# Patient Record
Sex: Female | Born: 1955 | Race: White | Hispanic: No | Marital: Married | Smoking: Never smoker
Health system: Southern US, Community
[De-identification: ages and names within clinical notes are randomized; demographics above are authoritative.]

## PROBLEM LIST (undated history)

## (undated) DIAGNOSIS — Q249 Congenital malformation of heart, unspecified: Secondary | ICD-10-CM

## (undated) DIAGNOSIS — G43909 Migraine, unspecified, not intractable, without status migrainosus: Secondary | ICD-10-CM

## (undated) HISTORY — PX: CHOLECYSTECTOMY: SHX55

## (undated) HISTORY — PX: WISDOM TOOTH EXTRACTION: SHX21

---

## 2016-08-20 ENCOUNTER — Encounter (HOSPITAL_COMMUNITY): Payer: Self-pay | Admitting: Emergency Medicine

## 2016-08-20 ENCOUNTER — Emergency Department (HOSPITAL_COMMUNITY)
Admission: EM | Admit: 2016-08-20 | Discharge: 2016-08-20 | Disposition: A | Discharge status: Home / Self Care | Payer: Medicare Other | Attending: Emergency Medicine | Admitting: Emergency Medicine

## 2016-08-20 ENCOUNTER — Emergency Department (HOSPITAL_COMMUNITY): Payer: Medicare Other

## 2016-08-20 DIAGNOSIS — S82001A Unspecified fracture of right patella, initial encounter for closed fracture: Secondary | ICD-10-CM

## 2016-08-20 DIAGNOSIS — Y9301 Activity, walking, marching and hiking: Secondary | ICD-10-CM | POA: Insufficient documentation

## 2016-08-20 DIAGNOSIS — W010XXA Fall on same level from slipping, tripping and stumbling without subsequent striking against object, initial encounter: Secondary | ICD-10-CM | POA: Diagnosis not present

## 2016-08-20 DIAGNOSIS — Y92481 Parking lot as the place of occurrence of the external cause: Secondary | ICD-10-CM | POA: Diagnosis not present

## 2016-08-20 DIAGNOSIS — S8991XA Unspecified injury of right lower leg, initial encounter: Secondary | ICD-10-CM | POA: Diagnosis present

## 2016-08-20 DIAGNOSIS — Y999 Unspecified external cause status: Secondary | ICD-10-CM | POA: Insufficient documentation

## 2016-08-20 DIAGNOSIS — W19XXXA Unspecified fall, initial encounter: Secondary | ICD-10-CM

## 2016-08-20 HISTORY — DX: Migraine, unspecified, not intractable, without status migrainosus: G43.909

## 2016-08-20 HISTORY — DX: Congenital malformation of heart, unspecified: Q24.9

## 2016-08-20 MED ORDER — TRAMADOL HCL 50 MG PO TABS: 50.0000 mg | ORAL_TABLET | Freq: Four times a day (QID) | ORAL | 0 refills | Status: AC | PRN

## 2016-08-20 MED ORDER — TRAMADOL HCL 50 MG PO TABS: 50.0000 mg | ORAL_TABLET | Freq: Once | ORAL | Status: DC

## 2016-08-20 NOTE — ED Notes (Signed)
Patient given paper scrub pants.

## 2016-08-20 NOTE — Discharge Instructions (Signed)
You were seen in the ED today with a fracture of the patella, a small bone in the knee. We placed a knee immobilizer here in the ED. You will need to follow up with your orthopedist in the coming week upon return home.   Return to the nearest ED with any chest pain, difficulty breathing, nausea, or vomiting.

## 2016-08-20 NOTE — ED Triage Notes (Signed)
Patient reports she was walking and tripped and fell on right knee and left face. Abrasion noted to right palm and right knee. Swelling to right knee. Patient is alert and oriented x4. Denies blood thinner use. Patient is normally on 3L [place]. Patient states high 80% oxygen saturation is normal for her.

## 2016-08-20 NOTE — ED Notes (Signed)
Made radiology aware that patient would like copy of knee xray.

## 2016-08-20 NOTE — ED Provider Notes (Signed)
Emergency Department Provider Note   I have reviewed the triage vital signs and the nursing notes.   HISTORY  Chief Complaint Fall   HPI Jocelyn Byrd is a 60 y.o. female with PMH of congenital heart defect and migraine HA who presents to the emergency department for evaluation after fall. The patient has right knee pain and swelling along with left cheek pain. She was walking in a parking lot when she had a mechanical fall over the curb. She denies any loss of consciousness. No vomiting since the incident. She has mild bleeding and pain over the right knee. No pain in the hip or ankle. No preceding chest pain, dyspnea, or heart palpitations. No abdominal discomfort. The patient is on oxygen for congenital heart disease and reports "feeling great." She is running out of her oxygen with him next shipment being due on Friday. She reports that she may return out before then but is using oxygen intermittently and will call them again to see if they can deliver at sooner. [place] and is visiting friends here in [place]. She has an orthopedist there.   Past Medical History:  Diagnosis Date  . Congenital heart defect   . Migraine     There are no active problems to display for this patient.   Past Surgical History:  Procedure Laterality Date  . CHOLECYSTECTOMY    . WISDOM TOOTH EXTRACTION        Allergies Review of patient's allergies indicates not on file.  No family history on file.  Social History Social History  Substance Use Topics  . Smoking status: Never Smoker  . Smokeless tobacco: Never Used  . Alcohol use No    Review of Systems  Constitutional: No fever/chills Eyes: No visual changes. ENT: No sore throat. Left cheek pain.  Cardiovascular: Denies chest pain. Respiratory: Denies shortness of breath. Gastrointestinal: No abdominal pain.  No nausea, no vomiting.  No diarrhea.  No constipation. Genitourinary: Negative for  dysuria. Musculoskeletal: Negative for back pain. Right knee pain.  Skin: Negative for rash. Neurological: Negative for headaches, focal weakness or numbness.  10-point ROS otherwise negative.  ____________________________________________   PHYSICAL EXAM:  VITAL SIGNS: ED Triage Vitals  Enc Vitals Group     BP 08/20/16 1518 116/78     Pulse Rate 08/20/16 1518 78     Resp 08/20/16 1518 18     Temp 08/20/16 1518 98.3 F (36.8 C)     Temp Source 08/20/16 1518 Oral     SpO2 08/20/16 1518 (!) 86 %     Weight 08/20/16 1522 110 lb (49.9 kg)     Height 08/20/16 1522 5\' 4"  (1.626 m)     Pain Score 08/20/16 1522 5   Constitutional: Alert and oriented. Well appearing and in no acute distress. Eyes: Conjunctivae are normal. PERRL. EOMI. Head: Atraumatic. Nose: No congestion/rhinnorhea. Mouth/Throat: Mucous membranes are moist.  Oropharynx non-erythematous. Neck: No stridor.  No cervical spine tenderness to palpation. Cardiovascular: Normal rate, regular rhythm. Good peripheral circulation. Grossly normal heart sounds.   Respiratory: Normal respiratory effort.  No retractions. Lungs CTAB. Gastrointestinal: Soft and nontender. No distention.  Musculoskeletal: Right knee pain and swelling with minor abrasions over the right knee. Normal ROM of the right hip and ankle. No proximal fibula tenderness. No gross deformities of extremities. Neurologic:  Normal speech and language. No gross focal neurologic deficits are appreciated.  Skin:  Skin is warm, dry and intact. No rash noted.  Abrasion to the left cheek. No tenderness to palpation. No surrounding edema.  Psychiatric: Mood and affect are normal. Speech and behavior are normal.  _____________________________________  RADIOLOGY  Dg Knee Complete 4 Views Right  Result Date: 08/20/2016 CLINICAL DATA:  Knee pain and swelling.  Fall. EXAM: RIGHT KNEE - COMPLETE 4+ VIEW COMPARISON:  No recent . FINDINGS: Patellar fracture with slight  displacement noted. Prominent knee joint effusion. Femur and tibia intact . IMPRESSION: Patellar fracture with slight displacement. Prominent knee joint effusion. Electronically Signed   By: Maisie Fus  Register   On: 08/20/2016 15:56    ____________________________________________   PROCEDURES  Procedure(s) performed:   Procedures  None ____________________________________________   INITIAL IMPRESSION / ASSESSMENT AND PLAN / ED COURSE  Pertinent labs & imaging results that were available during my care of the patient were reviewed by me and considered in my medical decision making (see chart for details).  Patient resents emergency department for evaluation after fall. She has right knee pain and swelling. Patella fracture noted on x-ray. Plan to apply knee immobilizer with crutches. No evidence of overlying joint laceration. No clear indication for additional imaging of the head or face after exam. Discussed return precautions in detail. The patient is on home oxygen with baseline low oxygen saturation because of a congenital heart defect. She will follow with her orthopedic surgeon in [facility] when she returns next week. Discussed increased risk for DVT formation with knee immobilizer placement and discussed return precautions for any worsening dyspnea or chest pain. We'll prescribe small amount of tramadol for use at home.  At this time, I do not feel there is any life-threatening condition present. I have reviewed and discussed all results (EKG, imaging, lab, urine as appropriate), exam findings with patient. I have reviewed nursing notes and appropriate previous records.  I feel the patient is safe to be discharged home without further emergent workup. Discussed usual and customary return precautions. Patient and family (if present) verbalize understanding and are comfortable with this plan.  Patient will follow-up with their primary care provider. If they do not have a primary care  provider, information for follow-up has been provided to them. All questions have been answered.  ____________________________________________  FINAL CLINICAL IMPRESSION(S) / ED DIAGNOSES  Final diagnoses:  Fall, initial encounter  Closed nondisplaced fracture of right patella, unspecified fracture morphology, initial encounter     MEDICATIONS GIVEN DURING THIS VISIT:  Medications  traMADol (ULTRAM) tablet 50 mg (not administered)     NEW OUTPATIENT MEDICATIONS STARTED DURING THIS VISIT:  New Prescriptions   TRAMADOL (ULTRAM) 50 MG TABLET    Take 1 tablet (50 mg total) by mouth every 6 (six) hours as needed.      Note:  This document was prepared using Dragon voice recognition software and may include unintentional dictation errors.  Jocelyn Bene, MD Emergency Medicine   Maia Plan, MD 08/20/16 [phone]

## 2016-08-20 NOTE — ED Notes (Signed)
Made ortho aware of knee immobilizer ordered.

## 2017-12-12 IMAGING — CR DG KNEE COMPLETE 4+V*R*
4 series · 4 of 4 positions shown · non-contrast
Comparison: No recent .

CLINICAL DATA: Knee pain and swelling.  Fall.

EXAM:
RIGHT KNEE - COMPLETE 4+ VIEW

[t knee ap right]
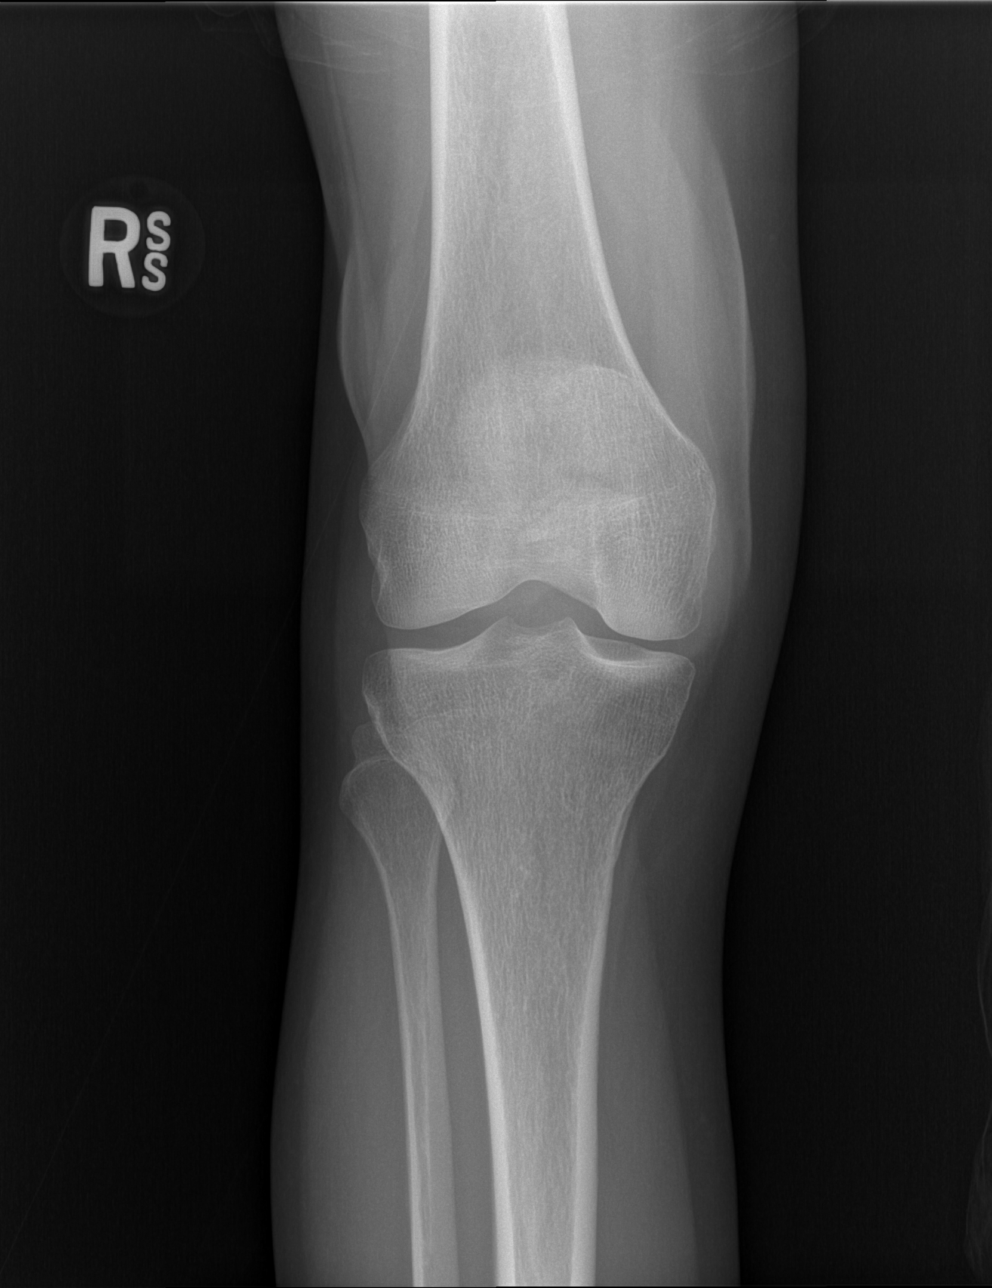

[t knee obl right (1 of 2)]
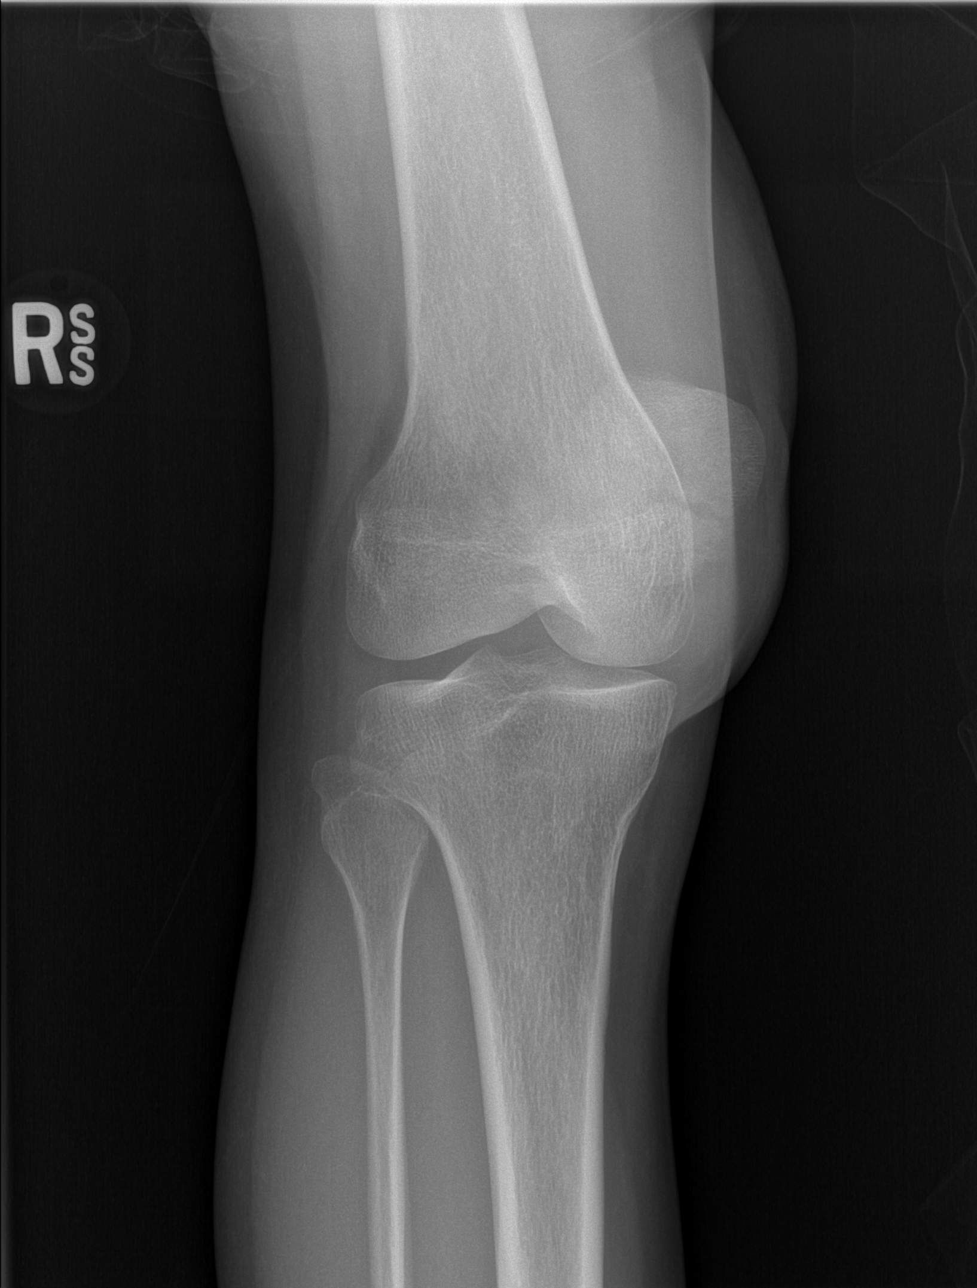

[t knee obl right (2 of 2)]
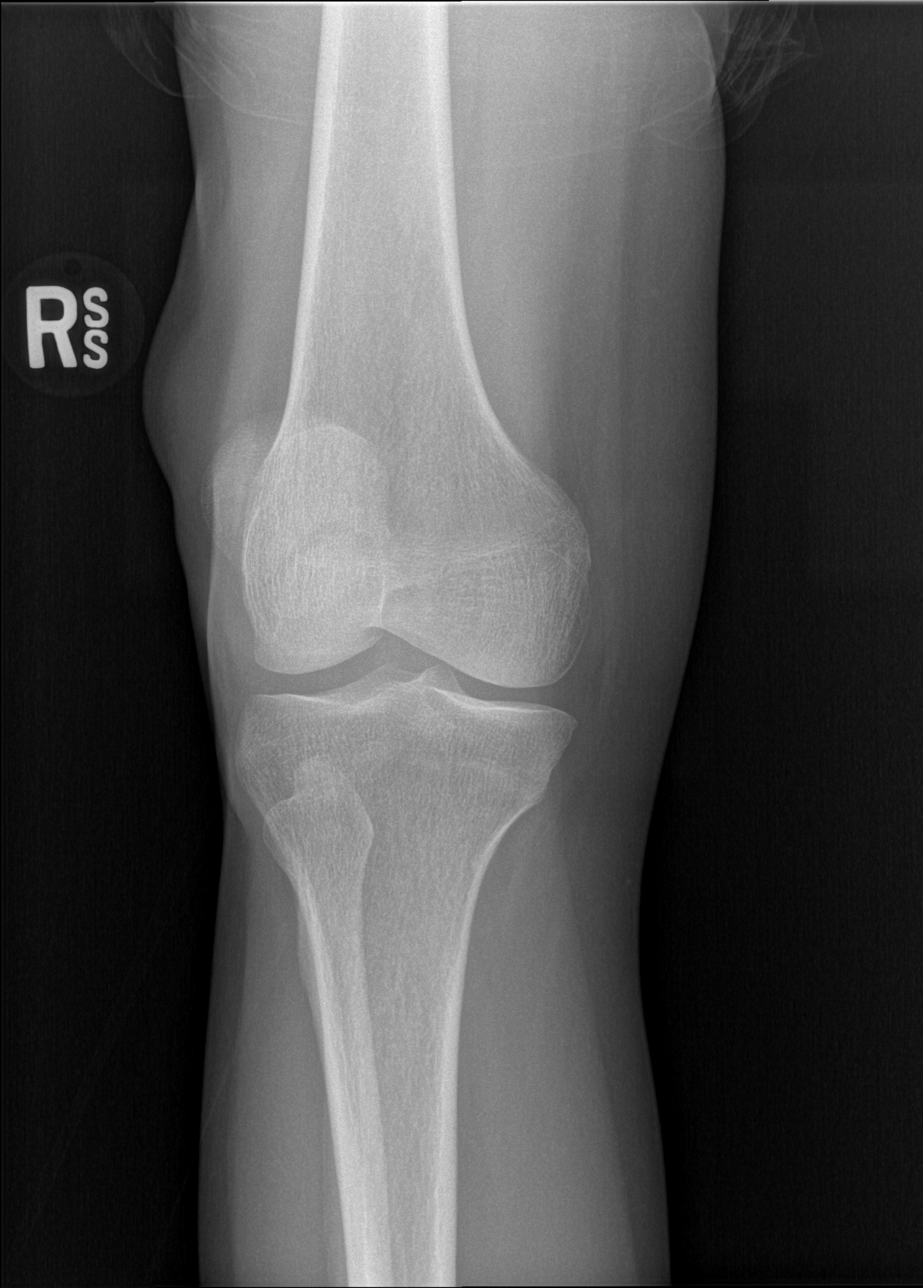

[t knee lat right]
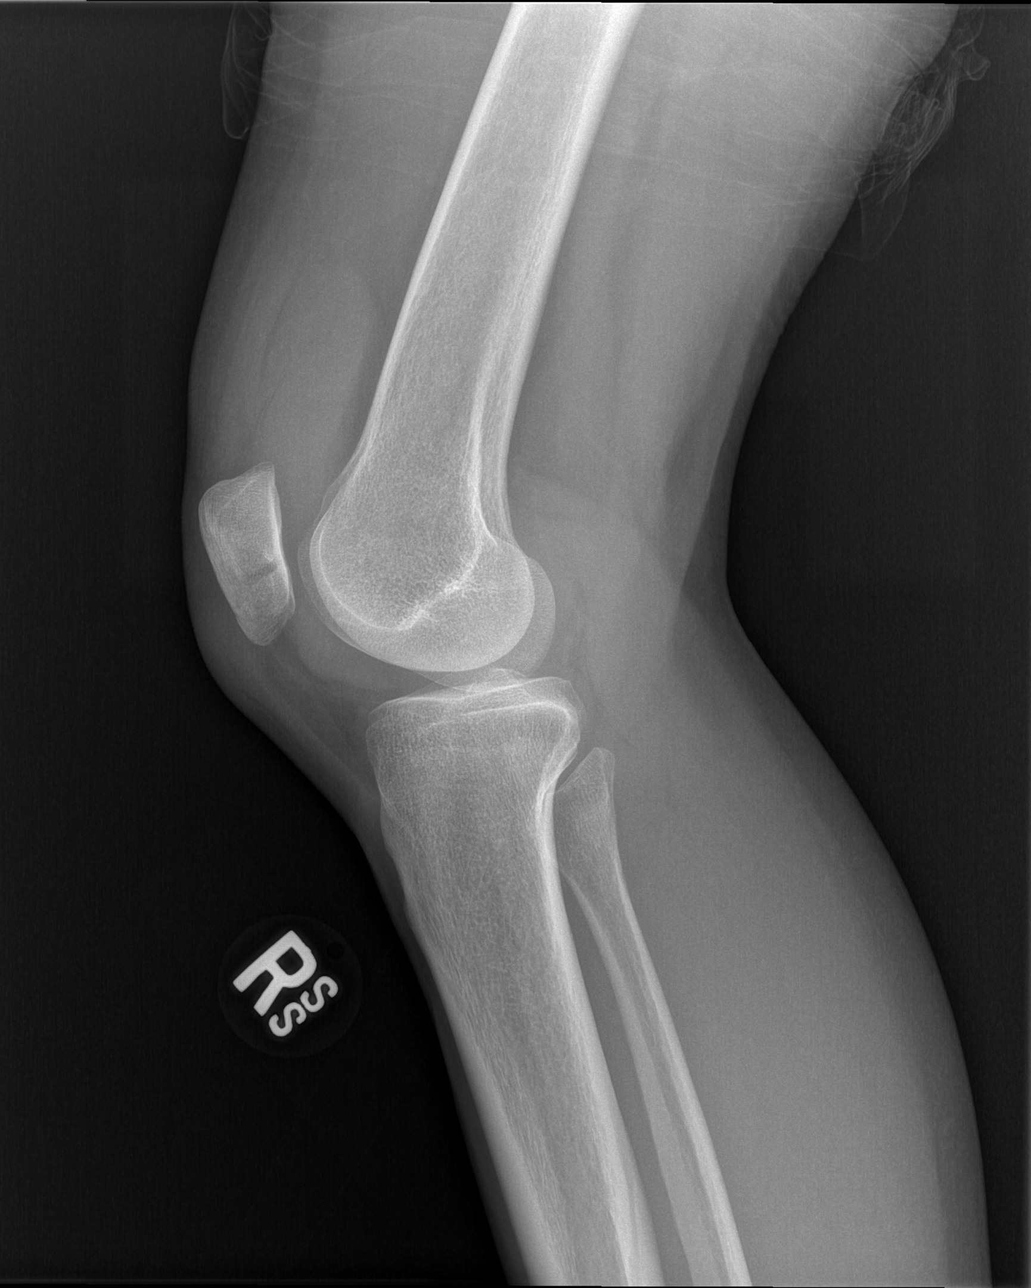

[4 of 4 positions shown; findings below may reference images not displayed]

FINDINGS: Patellar fracture with slight displacement noted. Prominent knee
joint effusion. Femur and tibia intact .
IMPRESSION: Patellar fracture with slight displacement. Prominent knee joint
effusion.
